# Patient Record
Sex: Male | Born: 1965 | Hispanic: Yes | Marital: Married
Health system: Midwestern US, Community
[De-identification: ages and names within clinical notes are randomized; demographics above are authoritative.]

---

## 2017-12-10 ENCOUNTER — Emergency Department: Admit: 2017-12-10 | Payer: PRIVATE HEALTH INSURANCE | Primary: Cardiovascular Disease

## 2017-12-10 ENCOUNTER — Inpatient Hospital Stay
Admit: 2017-12-10 | Discharge: 2017-12-10 | Disposition: A | Discharge status: Home / Self Care | Payer: PRIVATE HEALTH INSURANCE | Attending: Emergency Medicine

## 2017-12-10 DIAGNOSIS — R200 Anesthesia of skin: Secondary | ICD-10-CM

## 2017-12-10 LAB — METABOLIC PANEL, COMPREHENSIVE
A-G Ratio: 1.1 (ref 0.7–2.8)
ALT (SGPT): 34 U/L (ref 13–61)
AST (SGOT): 14 U/L — ABNORMAL LOW (ref 15–37)
Albumin: 4.1 g/dL (ref 3.5–4.7)
Alk. phosphatase: 90 U/L (ref 45–117)
Anion gap: 11 mmol/L (ref 10–20)
BUN: 16 mg/dL (ref 7–18)
Bilirubin, total: 0.6 mg/dL (ref 0.2–1.0)
CO2: 28 mmol/L (ref 21–32)
Calcium: 8.6 mg/dL (ref 8.5–10.1)
Chloride: 105 mmol/L (ref 98–107)
Creatinine: 0.95 mg/dL (ref 0.70–1.30)
GFR est AA: >60 mL/min/{1.73_m2} (ref >60)
GFR est non-AA: >60 mL/min/{1.73_m2} (ref >60)
Globulin: 3.8 g/dL (ref 1.7–4.7)
Glucose: 138 mg/dL — ABNORMAL HIGH (ref 74–106)
Potassium: 3.4 mmol/L — ABNORMAL LOW (ref 3.5–5.1)
Protein, total: 7.9 g/dL (ref 6.4–8.2)
Sodium: 140 mmol/L (ref 136–145)

## 2017-12-10 LAB — CBC WITH AUTOMATED DIFF
ABS. BASOPHILS: 0 10*3/uL (ref 0.0–0.1)
ABS. EOSINOPHILS: 0.1 10*3/uL (ref 0.0–0.7)
ABS. LYMPHOCYTES: 2.4 10*3/uL (ref 1.5–3.5)
ABS. MONOCYTES: 0.5 10*3/uL (ref 0.0–1.0)
ABS. NEUTROPHILS: 5.7 10*3/uL (ref 1.5–6.6)
BASOPHILS: 0 % (ref 0.0–3.0)
EOSINOPHILS: 1 % (ref 0.0–7.0)
HCT: 43.1 % (ref 42.0–52.0)
HGB: 14.9 g/dL (ref 14.0–18.0)
IMMATURE GRANULOCYTES: 0 % (ref 0.0–2.0)
LYMPHOCYTES: 28 % (ref 18.0–40.0)
MCH: 29.8 PG (ref 27.0–35.0)
MCHC: 34.6 g/dL (ref 30.7–37.3)
MCV: 86.2 FL (ref 81.0–94.0)
MONOCYTES: 6 % (ref 2.0–12.0)
MPV: 10.1 FL (ref 9.2–11.8)
NEUTROPHILS: 65 % (ref 48.0–72.0)
PLATELET: 316 10*3/uL (ref 130–400)
RBC: 5 M/uL (ref 4.70–6.00)
RDW: 12.9 % (ref 11.5–14.0)
WBC: 8.7 10*3/uL (ref 4.8–10.6)

## 2017-12-10 LAB — PROTHROMBIN TIME + INR
INR: 1 (ref 0.8–1.2)
Prothrombin time: 10.2 s (ref 9.4–11.1)

## 2017-12-10 LAB — MAGNESIUM: Magnesium: 2.3 mg/dL (ref 1.6–2.6)

## 2017-12-10 LAB — TROPONIN I: Troponin-I, Qt.: <0.02 NG/ML (ref 0.00–0.05)

## 2017-12-10 LAB — PTT: aPTT: 23.6 s (ref 21.0–28.0)

## 2017-12-10 LAB — BNP: BNP: 30 pg/mL (ref 0–100)

## 2017-12-10 LAB — GLUCOSE, POC: Glucose, bedside: 162 MG/DL — ABNORMAL HIGH (ref 65–110)

## 2017-12-10 NOTE — ED Provider Notes (Signed)
The history is provided by the patient.        12:09 PM: Richard Archer. is a 52 y.o. male with history of hypertension, vertigo who presents to the Emergency Department via ambulance sent from urgent care for evaluation after complaining of numbness in the left 4th and 5th digits of the hand, and outside of his left foot. Patient reports his numbness began yesterday morning around 8:30 am with associated balance abnormality, which resolved on its own. He reports the numbness from yesterday has mostly resolved. He denies any facial tingling, speech problem, present balance problem, or headache. At present, the patient has no other symptoms other than slight residual numbness. He has no family history of neurological illness. No other complaints at this time.          Past Medical History:   Diagnosis Date   ??? Hypertension    ??? Vertigo     2018 diagnosed       History reviewed. No pertinent surgical history.      History reviewed. No pertinent family history.    Social History     Socioeconomic History   ??? Marital status: MARRIED     Spouse name: Not on file   ??? Number of children: Not on file   ??? Years of education: Not on file   ??? Highest education level: Not on file   Occupational History   ??? Not on file   Social Needs   ??? Financial resource strain: Not on file   ??? Food insecurity:     Worry: Not on file     Inability: Not on file   ??? Transportation needs:     Medical: Not on file     Non-medical: Not on file   Tobacco Use   ??? Smoking status: Never Smoker   ??? Smokeless tobacco: Never Used   Substance and Sexual Activity   ??? Alcohol use: Never     Frequency: Never   ??? Drug use: Never   ??? Sexual activity: Yes     Partners: Female   Lifestyle   ??? Physical activity:     Days per week: Not on file     Minutes per session: Not on file   ??? Stress: Not on file   Relationships   ??? Social connections:     Talks on phone: Not on file     Gets together: Not on file     Attends religious service: Not on file      Active member of club or organization: Not on file     Attends meetings of clubs or organizations: Not on file     Relationship status: Not on file   ??? Intimate partner violence:     Fear of current or ex partner: Not on file     Emotionally abused: Not on file     Physically abused: Not on file     Forced sexual activity: Not on file   Other Topics Concern   ??? Not on file   Social History Narrative   ??? Not on file         ALLERGIES: Patient has no known allergies.    Review of Systems   Constitutional: Negative for chills and fever.   HENT: Negative for rhinorrhea and sore throat.    Eyes: Negative for photophobia and visual disturbance.   Respiratory: Negative for cough and shortness of breath.    Cardiovascular: Negative for chest pain  and leg swelling.   Gastrointestinal: Negative for abdominal pain, diarrhea, nausea and vomiting.   Genitourinary: Negative for dysuria and hematuria.   Musculoskeletal: Negative for back pain, gait problem and myalgias.   Skin: Negative for color change and pallor.   Neurological: Positive for numbness (left 4th and 5th digit, left foot). Negative for dizziness, tremors, seizures, syncope, facial asymmetry, speech difficulty, weakness and headaches.        Negative balance abnormality.       Vitals:    12/10/17 1144 12/10/17 1152   BP: (!) 170/91    Pulse: 68    Resp: 18    Temp: 98.4 ??F (36.9 ??C)    SpO2: 98%    Weight:  117.5 kg (259 lb)   Height:  '5\' 11"'$  (1.803 m)          12:09 PM Pulse Oximetry reading is 98 % on room air, which indicates normal oxygenation per Ronn Melena, MD.       Physical Exam   Constitutional: He is oriented to person, place, and time. He appears well-developed and well-nourished. No distress.   HENT:   Head: Normocephalic and atraumatic.   Mouth/Throat: Oropharynx is clear and moist.   Eyes: Pupils are equal, round, and reactive to light. Conjunctivae and EOM are normal. No scleral icterus.   No conjunctival pallor    Neck: Normal range of motion. Neck supple.   Cardiovascular: Normal rate, regular rhythm and normal heart sounds.   Pulses:       Dorsalis pedis pulses are 2+ on the right side, and 2+ on the left side.        Posterior tibial pulses are 2+ on the right side, and 2+ on the left side.   Pulmonary/Chest: Effort normal and breath sounds normal. No respiratory distress. He has no wheezes. He has no rales.   Abdominal: Soft. Bowel sounds are normal. There is no tenderness. There is no rebound and no guarding.   Musculoskeletal: Normal range of motion.   No tenderness or swelling to lower extremities.   Neurological: He is alert and oriented to person, place, and time. He has normal strength. No cranial nerve deficit or sensory deficit.   No facial assymetry, speech clear and fluent, cranial nerves 2-12 grossly intact. Strength and sensation intact throughout, no ataxia   NIHSS=0   Skin: Skin is warm and dry. No rash noted. No pallor.   Nursing note and vitals reviewed.       MDM  Number of Diagnoses or Management Options  Numbness and tingling of foot:   Numbness and tingling of hand:      Amount and/or Complexity of Data Reviewed  Clinical lab tests: ordered and reviewed  Tests in the radiology section of CPT??: ordered and reviewed  Decide to obtain previous medical records or to obtain history from someone other than the patient: yes  Review and summarize past medical records: yes  Discuss the patient with other providers: yes  Independent visualization of images, tracings, or specimens: yes           Procedures    I, Navdeep Halt, Samuel Germany, MD, reviewed the patient's past history, allergies and home medications as documented in the nursing chart.  Labs:  Recent Results (from the past 12 hour(s))   EKG, 12 LEAD, INITIAL    Collection Time: 12/10/17 11:46 AM   Result Value Ref Range    Ventricular Rate 70 BPM    Atrial Rate 70 BPM  P-R Interval 184 ms    QRS Duration 100 ms    Q-T Interval 416 ms     QTC Calculation (Bezet) 449 ms    Calculated P Axis 42 degrees    Calculated R Axis -32 degrees    Calculated T Axis 42 degrees    Diagnosis       Normal sinus rhythm  Left axis deviation  Abnormal ECG  No previous ECGs available     GLUCOSE, POC    Collection Time: 12/10/17 11:47 AM   Result Value Ref Range    Glucose, bedside 162 (H) 65 - 161 MG/DL   METABOLIC PANEL, COMPREHENSIVE    Collection Time: 12/10/17 12:47 PM   Result Value Ref Range    Sodium 140 136 - 145 mmol/L    Potassium 3.4 (L) 3.5 - 5.1 mmol/L    Chloride 105 98 - 107 mmol/L    CO2 28 21 - 32 mmol/L    Anion gap 11 10 - 20 mmol/L    Glucose 138 (H) 74 - 106 mg/dL    BUN 16 7 - 18 mg/dL    Creatinine 0.95 0.70 - 1.30 mg/dL    GFR est AA >60 >60 ml/min/1.108m    GFR est non-AA >60 >60 ml/min/1.728m   Calcium 8.6 8.5 - 10.1 mg/dL    Bilirubin, total 0.6 0.2 - 1.0 mg/dL    ALT (SGPT) 34 13 - 61 U/L    AST (SGOT) 14 (L) 15 - 37 U/L    Alk. phosphatase 90 45 - 117 U/L    Protein, total 7.9 6.4 - 8.2 g/dL    Albumin 4.1 3.5 - 4.7 g/dL    Globulin 3.8 1.7 - 4.7 g/dL    A-G Ratio 1.1 0.7 - 2.8     CBC WITH AUTOMATED DIFF    Collection Time: 12/10/17 12:47 PM   Result Value Ref Range    WBC 8.7 4.8 - 10.6 K/uL    RBC 5.00 4.70 - 6.00 M/uL    HGB 14.9 14.0 - 18.0 g/dL    HCT 43.1 42.0 - 52.0 %    MCV 86.2 81.0 - 94.0 FL    MCH 29.8 27.0 - 35.0 PG    MCHC 34.6 30.7 - 37.3 g/dL    RDW 12.9 11.5 - 14.0 %    PLATELET 316 130 - 400 K/uL    MPV 10.1 9.2 - 11.8 FL    NEUTROPHILS 65 48.0 - 72.0 %    LYMPHOCYTES 28 18.0 - 40.0 %    MONOCYTES 6 2.0 - 12.0 %    EOSINOPHILS 1 0.0 - 7.0 %    BASOPHILS 0 0.0 - 3.0 %    ABS. NEUTROPHILS 5.7 1.5 - 6.6 K/UL    ABS. LYMPHOCYTES 2.4 1.5 - 3.5 K/UL    ABS. MONOCYTES 0.5 0.0 - 1.0 K/UL    ABS. EOSINOPHILS 0.1 0.0 - 0.7 K/UL    ABS. BASOPHILS 0.0 0.0 - 0.1 K/UL    DF AUTOMATED      IMMATURE GRANULOCYTES 0 0.0 - 2.0 %   TROPONIN I    Collection Time: 12/10/17 12:47 PM   Result Value Ref Range     Troponin-I, Qt. <0.02 0.00 - 0.05 NG/ML   MAGNESIUM    Collection Time: 12/10/17 12:47 PM   Result Value Ref Range    Magnesium 2.3 1.6 - 2.6 mg/dL   BNP    Collection Time: 12/10/17 12:47 PM   Result Value  Ref Range    BNP 30 0 - 100 pg/mL   PROTHROMBIN TIME + INR    Collection Time: 12/10/17 12:47 PM   Result Value Ref Range    Prothrombin time 10.2 9.4 - 11.1 sec    INR 1.0 0.8 - 1.2     PTT    Collection Time: 12/10/17 12:47 PM   Result Value Ref Range    aPTT 23.6 21.0 - 28.0 SEC     EKG: sinus rhythm at a rate of 70 BPM, no focal ST changes. Interpreted by Ronn Melena, MD    12:33 PM cardiac monitor reading shows sinus rhythm at 70 BPM. Interpreted by Ronn Melena, MD.    Radiology:    CT Results  (Last 48 hours)               12/10/17 1334  CT HEAD WO CONT Final result    Impression:  IMPRESSION:       Unremarkable CT scan of the brain. Should clinical concern remain further   evaluation with an MRI may be helpful.       Narrative:  History:       Left-sided numbness.       FINDINGS:       CT scanning of the brain was performed with contiguous axial images from the   skull base to the vertex without intravenous contrast material. Sagittal and   coronal reconstructed images are submitted. Scanning was performed utilizing   dose lowering techniques. No prior studies are available for comparison.       There is no evidence of intra or extra-axial hemorrhage. There is no evidence of   midline shift. There is no evidence of mass lesion. There is no definite   evidence of any area of acute cortical infarct.       The ventricles are normal in size and position.       Evaluation of bony structures is unremarkable.               Radiology interpretation reviewed by Ronn Melena, MD      <EMERGENCY DEPARTMENT CASE SUMMARY>  Impression/Differential Diagnosis: rule out TIA, stroke, electrolyte abnormality, neuropathy     ED Course:    52 y.o. male presented to the ED complaining of numbness to the left hand and left foot.     12:33 PM: Will order labs, troponin level, EKG, CT Head, and cardiac monitoring. Will consult Neurology.     1:03 PM: Dr. Elvia Collum (neurology) in the ED to evaluate the patient.     2:00 PM: Case presentation and findings discussed with Dr. Elvia Collum who states the patient is clear from a neurological standpoint and requests outpatient follow up.     2:37 PM: Patient was reassessed prior to discharge. Patient???s symptoms remained stable. I personally discussed discharge plan with patient/guardian, who understands instructions. All questions were answered.  Patient/guardian advised to follow up with PMD and/or Neurologist. Patient/guardian instructed to return to the ER if symptoms persist, change or worsen. Patient agrees with plan.      Final Impression/Diagnosis:   Encounter Diagnoses     ICD-10-CM ICD-9-CM   1. Numbness and tingling of hand R20.0 782.0    R20.2    2. Numbness and tingling of foot R20.0 782.0    R20.2        Patient condition at time of disposition: stable     I have reviewed the following home medications:  Prior to Admission medications    Medication Sig Start Date End Date Taking? Authorizing Provider   nebivolol (BYSTOLIC) 10 mg tablet Take 10 mg by mouth daily. Indications: high blood pressure   Yes Other, Phys, MD   amLODIPine (NORVASC) 10 mg tablet Take 10 mg by mouth daily. Indications: high blood pressure   Yes Other, Phys, MD       Durenda Guthrie, Samuel Germany, MD      I, [place], am serving as a scribe to document services personally performed by Ronn Melena, MD based on my observation and the provider's statements to me.    IRonn Melena, MD, attest that the person(s) noted above, acting as my scribe(s) noted above, has observed my performance of the services and has documented them in accordance with my direction. I have personally  reviewed the above information and have ordered and reviewed the diagnostic studies, unless otherwise noted.

## 2017-12-10 NOTE — ED Notes (Signed)
Critical labs (Potassium) charted on this chart in error

## 2017-12-10 NOTE — ED Notes (Signed)
POC glucose 162. Brain alert cancelled by Dr Prentiss BellsEllerman.

## 2017-12-10 NOTE — ED Notes (Signed)
Pt seen & evaluated by neuro MD.

## 2017-12-10 NOTE — ED Triage Notes (Signed)
Pt reports yesterday he woke up at 0600 normal, went back to sleep awaking at 0830 and noticed he had numbness to left hand and down left leg. Pt also reports he felt "off" noting his gait was not at baseline. Pt sts symptoms were slightly still present today so he went to urgent care and was sent immediately to ED. Brain alert called in triage. Neuro otherwise intact. Pt denies slurred speech, no facial droop present, strength equal bilaterally. Per patient tingling and numbness to hands and leg improving but slightly still present now. Pt sts gait is improving as well since arrival.

## 2017-12-10 NOTE — Consults (Signed)
Richard BumpersSweta Sage Hammill, MD  [address], [address]  [place], [place] [id number]  [phone]  [email or web address] CONSULT NOTE  [facility] Age 52 y.o.   MRN [id number] DOB 06/30/1966       Consulting Physician: Richard BumpersSweta Benedicto Capozzi, MD     Referring Physician: Prentiss BellsEllerman  Chief Complaint:    Chief Complaint   Patient presents with   ??? Numbness   ??? Gait Problem       History of Present Illness:      This is a 52 y.o. man with HTN, who woke up yesterday  with tingling of his left 4th and 5th finger as well as the left 4th and 5th toes and outside of his left foot. Has received cortisone shot for left tennis elbow recently. Denies facial involvement and no weakness.    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          Past Medical History:   Diagnosis Date   ??? Hypertension    ??? Vertigo     2018 diagnosed        History reviewed. No pertinent surgical history.     Current Outpatient Medications   Medication Sig Dispense Refill   ??? nebivolol (BYSTOLIC) 10 mg tablet Take 10 mg by mouth daily. Indications: high blood pressure     ??? amLODIPine (NORVASC) 10 mg tablet Take 10 mg by mouth daily. Indications: high blood pressure         No Known Allergies     History reviewed. No pertinent family history.     Social History     Tobacco Use   ??? Smoking status: Never Smoker   ??? Smokeless tobacco: Never Used   Substance Use Topics   ??? Alcohol use: Never     Frequency: Never          Review of Systems:     Neurological: per hpi  Constitutional: negative for fevers, chills and fatigue  Eyes: No visual disturbances.    Ears, nose, mouth, throat, and face: negative for hearing loss, tinnitus   Respiratory: negative for cough, sputum or hemoptysis  Cardiovascular: negative for chest pain, dyspnea, palpitations  Gastrointestinal: negative for nausea, vomiting and constipation   Genitourinary:negative for dysuria, nocturia and urinary incontinence  Hematologic/lymphatic: negative for bleeding, lymphadenopathy and petechiae  Musculoskeletal:negative for myalgias, arthralgias, stiff joints and neck pain  Behvioral/Psych: negative for anxiety, mood swings and sleep disturbance.      Exam:       Patient Vitals for the past 24 hrs:   Temp Pulse Resp BP SpO2   12/10/17 1144 98.4 ??F (36.9 ??C) 68 18 (!) 170/91 98 %       General: Well developed, well nourished. Patient in no apparent distress                       Skin: No overt signs of rash     Neurological Exam:  Mental Status: Awake, alert and attentive.     Language: Fluent and prosodic without paraphasic errors.  Naming and repetition intact.     Cranial Nerves:    EOMI without nystagmus, no ptosis.  Facial sensation is symmetric to light touch and cold. Facial movement is symmetric.  Palate is midline.  Hearing grossly symmetric, Sternocleidomastoid and trapezius full power  bilaterally.  Tongue is midline.   Eyes: Noninjected sclerae.   Motor:  Full power X4.     Reflexes:   Toes downgoing on left and mute on rt   Sensory:   Symmetric to pinprick in b/l ulnar distribution as well as lateral foot   Gait:  Deferred   Cerebellar:  No dysmetria           Imaging  CT Results (most recent):  No results found for this or any previous visit.  MRI Results (most recent):  No results found for this or any previous visit.      Lab Review    No components found for: TROPQUANT  No results found for: ANA  Recent Results (from the past 24 hour(s))   EKG, 12 LEAD, INITIAL    Collection Time: 12/10/17 11:46 AM   Result Value Ref Range    Ventricular Rate 70 BPM    Atrial Rate 70 BPM    P-R Interval 184 ms    QRS Duration 100 ms    Q-T Interval 416 ms    QTC Calculation (Bezet) 449 ms    Calculated P Axis 42 degrees    Calculated R Axis -32 degrees    Calculated T Axis 42 degrees    Diagnosis       Normal sinus rhythm  Left axis deviation  Abnormal ECG   No previous ECGs available     GLUCOSE, POC    Collection Time: 12/10/17 11:47 AM   Result Value Ref Range    Glucose, bedside 162 (H) 65 - 110 MG/DL     No results found for: CHOL, CHOLPOCT, CHOLX, CHLST, CHOLV, HDL, HDLPOC, LDL, LDLCPOC, LDLC, DLDLP, VLDLC, VLDL, TGLX, TRIGL, TRIGP, TGLPOCT, CHHD, CHHDX      Assessment/Plan:   There is no problem list on file for this patient.      Patient has had tingling in left ulnar distribution and left S1 distribution.  CT head shows no acute and clinical likelihood for a cerebrovascular event is low.    Have recommended f/u as outpatient. Can get a MRI Brain as outpatient if needed. Should benefit from a outpatient EMG of both arms and both legs.    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      Thank you for involving me in care of this patient. Please call with questions or concerns.    Richard Bumpers, MD  [phone]       80 mins was spent taking care of this patient that includes review of prior and current records, coordination of care and discussions with family.

## 2017-12-10 NOTE — ED Notes (Signed)
Pt to CT via stretcher.

## 2017-12-10 NOTE — Consults (Signed)
Consults by Era Bumpers,  MD at 12/10/17 1248                Author: Era Bumpers, MD  Service: Neurology  Author Type: Physician       Filed: 12/10/17 1425  Date of Service: 12/10/17 1248  Status: Signed          Editor: Era Bumpers, MD (Physician)            Consult Orders        1. IP CONSULT TO NEUROLOGY [[id number]] ordered by Blima Singer, MD at 12/10/17 1233                                     Era Bumpers, MD   [address], [address]   [place], [place] [address]  [phone]   [email or web address]      NEUROLOGY CONSULT NOTE           Name  Richard Archer [facility].  Age  52 y.o.          MRN  [id number]  DOB  [birth year]           Consulting Physician: Era Bumpers, MD       Referring Physician: Prentiss Bells   Chief Complaint:       Chief Complaint       Patient presents with        ?  Numbness        ?  Gait Problem             History of Present Illness:         This is a 52 y.o. man with HTN, who woke up yesterday  with tingling of his left 4th and 5th finger as well as the left 4th and 5th toes and  outside of his left foot. Has received cortisone shot for left tennis elbow recently. Denies facial involvement and no weakness.      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                 Past Medical History:        Diagnosis  Date         ?  Hypertension       ?  Vertigo            2018 diagnosed            History reviewed. No pertinent surgical history.         Current Outpatient Medications          Medication  Sig  Dispense  Refill           ?  nebivolol (BYSTOLIC) 10 mg tablet  Take 10 mg by mouth daily. Indications: high blood pressure               ?  amLODIPine (NORVASC) 10 mg tablet  Take 10 mg by mouth daily. Indications: high blood pressure               No Known Allergies       History reviewed. No pertinent family history.         Social History  Tobacco Use          ?  Smoking status:  Never Smoker     ?  Smokeless tobacco:  Never Used       Substance Use Topics         ?  Alcohol use:  Never              Frequency:  Never                 Review of Systems:        Neurological: per hpi   Constitutional: negative for fevers, chills and fatigue   Eyes: No visual disturbances.     Ears, nose, mouth, throat, and face: negative for hearing loss, tinnitus    Respiratory: negative for cough, sputum or hemoptysis   Cardiovascular: negative for chest pain, dyspnea, palpitations   Gastrointestinal: negative for nausea, vomiting and constipation   Genitourinary:negative for dysuria, nocturia and urinary incontinence   Hematologic/lymphatic: negative for bleeding, lymphadenopathy and petechiae   Musculoskeletal:negative for myalgias, arthralgias, stiff joints and neck pain   Behvioral/Psych: negative for anxiety, mood swings and sleep disturbance.         Exam:           Patient Vitals for the past 24 hrs:            Temp  Pulse  Resp  BP  SpO2            12/10/17 1144  98.4 ??F (36.9 ??C)  68  18  (!) 170/91  98 %              General:  Well developed, well nourished. Patient in no apparent distress                                        Skin:  No overt signs of rash        Neurological Exam:      Mental Status:  Awake, alert and attentive.          Language:  Fluent and prosodic without paraphasic errors.  Naming and repetition intact.          Cranial Nerves:     EOMI without nystagmus, no ptosis.  Facial sensation is symmetric to light touch and cold. Facial movement is symmetric.  Palate is midline.  Hearing grossly  symmetric, Sternocleidomastoid and trapezius full power bilaterally.  Tongue is midline.        Eyes:  Noninjected sclerae.     Motor:   Full power X4.          Reflexes:    Toes downgoing on left and mute on rt        Sensory:    Symmetric to pinprick in b/l ulnar distribution as well as lateral foot     Gait:   Deferred        Cerebellar:   No dysmetria                      Imaging   CT Results (most recent):   No results found for this or any previous visit.   MRI Results (most recent):   No results found for this or any previous visit.         Lab Review      No components found for: TROPQUANT   No results  found for: ANA     Recent Results (from the past 24 hour(s))     EKG, 12 LEAD, INITIAL          Collection Time: 12/10/17 11:46 AM         Result  Value  Ref Range            Ventricular Rate  70  BPM       Atrial Rate  70  BPM       P-R Interval  184  ms       QRS Duration  100  ms       Q-T Interval  416  ms       QTC Calculation (Bezet)  449  ms       Calculated P Axis  42  degrees       Calculated R Axis  -32  degrees       Calculated T Axis  42  degrees       Diagnosis                 Normal sinus rhythm   Left axis deviation   Abnormal ECG   No previous ECGs available          GLUCOSE, POC          Collection Time: 12/10/17 11:47 AM         Result  Value  Ref Range            Glucose, bedside  162 (H)  65 - 110 MG/DL        No results found for: CHOL, CHOLPOCT, CHOLX, CHLST, CHOLV, HDL, HDLPOC, LDL, LDLCPOC, LDLC, DLDLP, VLDLC, VLDL, TGLX, TRIGL, TRIGP, TGLPOCT, CHHD, CHHDX           Assessment/Plan:     There is no problem list on file for this patient.         Patient has had tingling in left ulnar distribution and left S1 distribution.  CT head shows no acute and clinical likelihood for a cerebrovascular event is low.      Have recommended f/u as outpatient. Can get a MRI Brain as outpatient if needed. Should benefit from a outpatient EMG of both arms and both legs.      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         Thank you for involving me in care of this patient. Please call with questions or concerns.      Era BumpersSweta Mikena Masoner, MD   [phone]          80 mins was spent taking care of this patient that includes review of prior and current records, coordination of care and  discussions with family.

## 2017-12-12 LAB — EKG 12-LEAD
Atrial Rate: 70 {beats}/min
Diagnosis: NORMAL
P Axis: 42 degrees
P-R Interval: 184 ms
Q-T Interval: 416 ms
QRS Duration: 100 ms
QTc Calculation (Bazett): 449 ms
R Axis: -32 degrees
T Axis: 42 degrees
Ventricular Rate: 70 {beats}/min

## 2017-12-12 LAB — EKG, 12 LEAD, INITIAL
Atrial Rate: 70 {beats}/min
Calculated P Axis: 42 degrees
Calculated R Axis: -32 degrees
Calculated T Axis: 42 degrees
Diagnosis: NORMAL
P-R Interval: 184 ms
Q-T Interval: 416 ms
QRS Duration: 100 ms
QTC Calculation (Bezet): 449 ms
Ventricular Rate: 70 {beats}/min

## 2023-06-20 IMAGING — MR MRI BRAIN WITHOUT AND WITH CONTRAST
8 series · 48 of 48 positions shown · IV contrast (Off)
Comparison: Examination compared to prior examination dated 12/23/2021.

MRI OF THE BRAIN WITH & WITHOUT CONTRAST
CLINICAL HISTORY: History of CVA April 2021. Recent abnormal MRI scan.
TECHNIQUE: Multisequential multiplanar imaging was performed of the brain with and without the intravenous administration of 11 cc of Gadavist. Examination included diffusion-weighted imaging.

[Series 2: T1 · sagittal · 5.0mm · 0.90mm/px · 7 of 21 slices shown (1 of 4)]
[im 1/21]
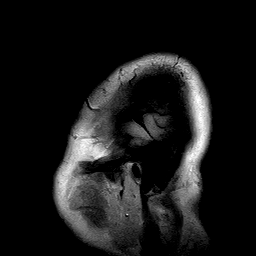
[im 4/21]
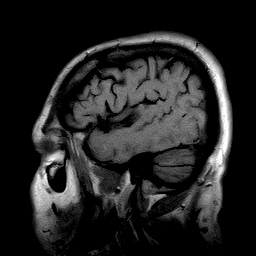
[im 7/21]
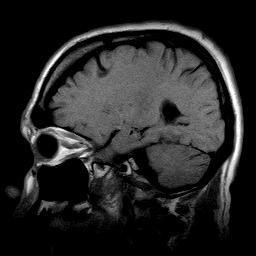
[im 11/21]
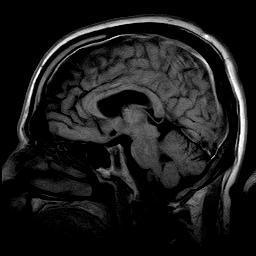
[im 14/21]
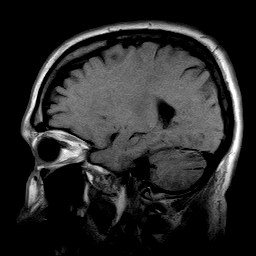
[im 17/21]
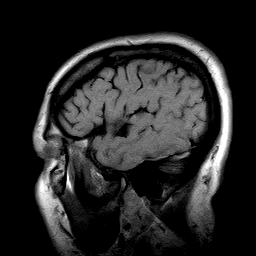
[im 21/21]
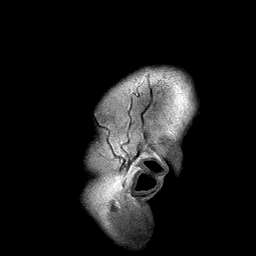

[Series 3: T2 · axial · 5.0mm · 0.94mm/px · z∈[-60,+87]mm · 7 of 22 slices shown]
[im 1/22]
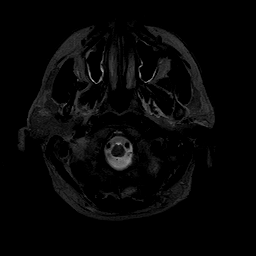
[im 4/22]
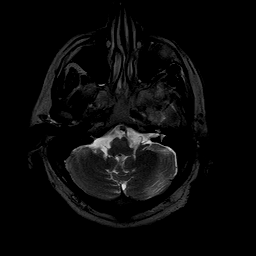
[im 8/22]
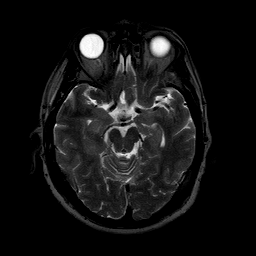
[im 11/22]
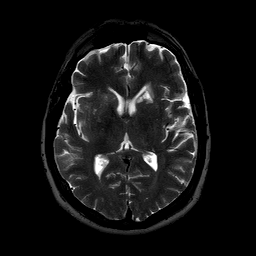
[im 15/22]
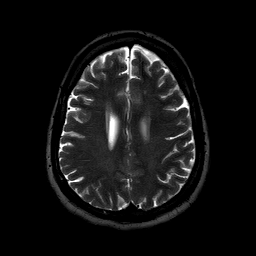
[im 18/22]
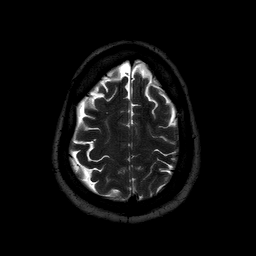
[im 22/22]
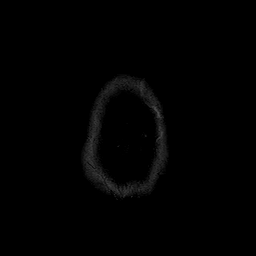

[Series 4: T1 · axial · 5.0mm · 0.94mm/px · z∈[-60,+87]mm · 6 of 22 slices shown (2 of 4)]
[im 1/22]
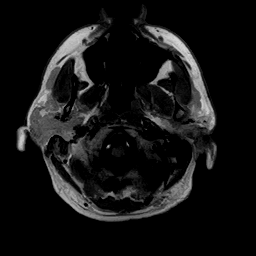
[im 5/22]
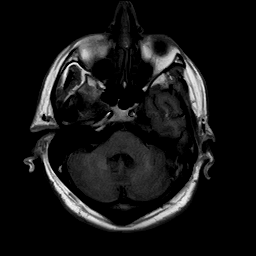
[im 9/22]
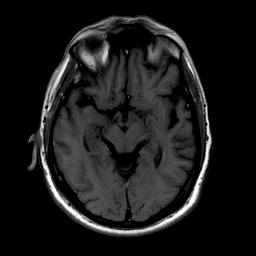
[im 13/22]
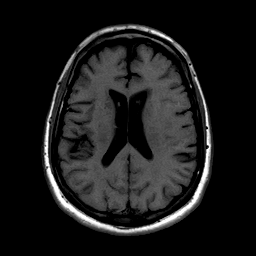
[im 17/22]
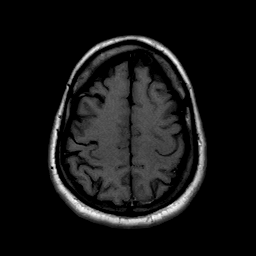
[im 22/22]
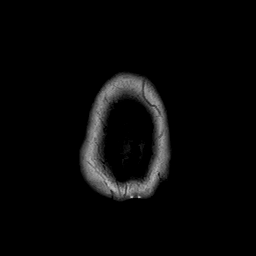

[Series 10: DWI · axial · 6.0mm · 1.88mm/px · z∈[-59,+77]mm · 5 of 18 slices shown]
[im 1/18]
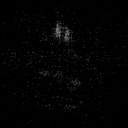
[im 5/18]
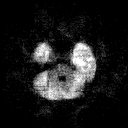
[im 9/18]
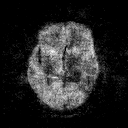
[im 13/18]
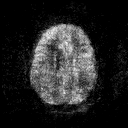
[im 18/18]
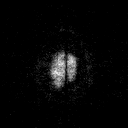

[Series 11: ADC · axial · 6.0mm · 1.88mm/px · z∈[-59,+77]mm · 5 of 18 slices shown]
[im 1/18]
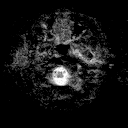
[im 5/18]
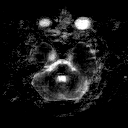
[im 9/18]
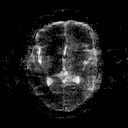
[im 13/18]
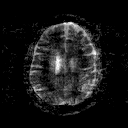
[im 18/18]
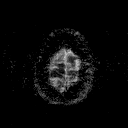

[Series 12: FLAIR · axial · 5.0mm · 0.94mm/px · z∈[-60,+87]mm · 6 of 22 slices shown]
[im 1/22]
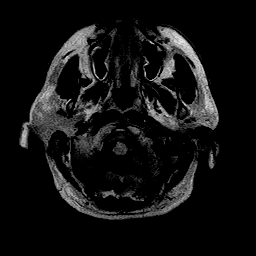
[im 5/22]
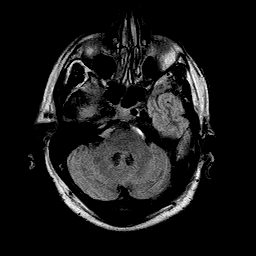
[im 9/22]
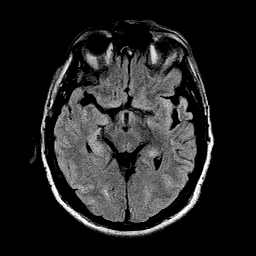
[im 13/22]
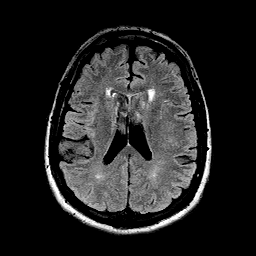
[im 17/22]
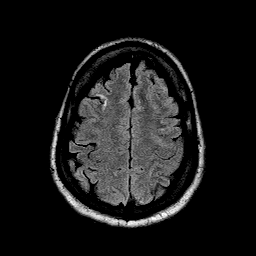
[im 22/22]
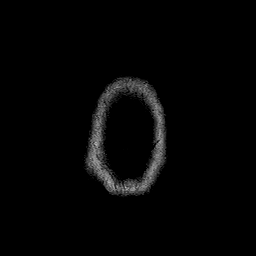

[Series 13: T1 · axial · 5.0mm · 0.94mm/px · z∈[-60,+87]mm · 6 of 22 slices shown (3 of 4)]
[im 1/22]
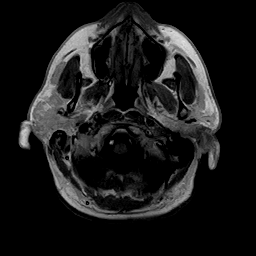
[im 5/22]
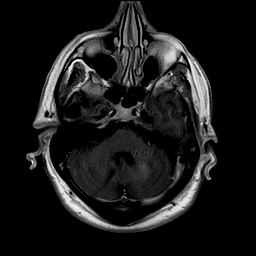
[im 9/22]
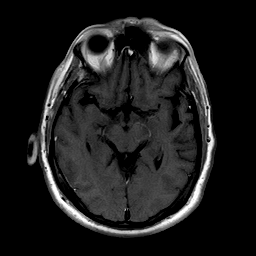
[im 13/22]
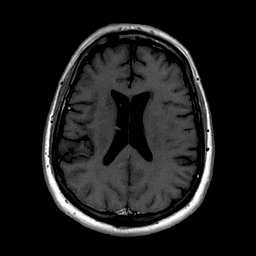
[im 17/22]
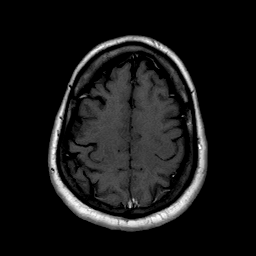
[im 22/22]
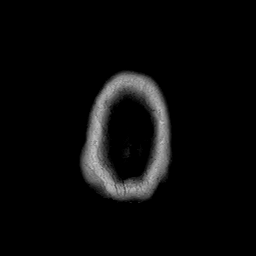

[Series 14: T1 · coronal · 5.0mm · 0.90mm/px · 6 of 20 slices shown (4 of 4)]
[im 1/20]
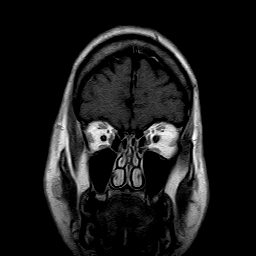
[im 4/20]
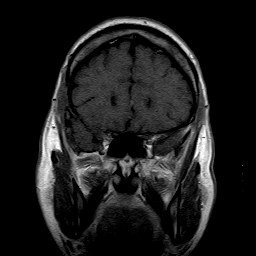
[im 8/20]
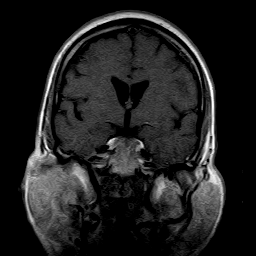
[im 12/20]
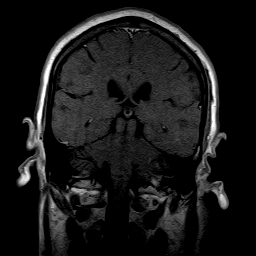
[im 16/20]
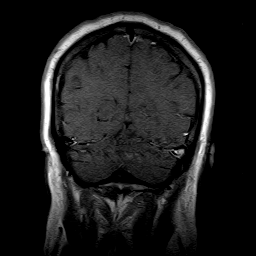
[im 20/20]
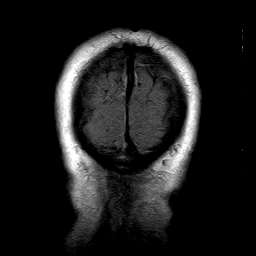

[48 of 48 positions shown; findings below may reference images not displayed]

We also have the report but not the imaging from an MRI dated 12/07/2022, which was also an outside MRI.
FINDINGS: There is no MRI evidence of hemorrhage, mass effect, midline shift, extra-axial fluid collections, or hydrocephalus. There does appear to be involutional change as well as significant demyelination in the periventricular and deep white matter. There is a more focal area of subcortical abnormal signal intensity in the right frontal region. No abnormal enhancement identified. 

Prior exam raised the concern of an area of increased signal intensity in the medulla oblongata. There is an area of increased signal intensity in that area, however, no abnormal enhancement associated with this or the remainder of the brain. There is no evidence of restricted diffusion. 

There does appear to be some mild ethmoid and maxillary sinus inflammatory change.
IMPRESSION: 1.
Involutional change with prominence of the ventricles and sulci as well as demyelination in the periventricular and deep white matter consistent with small vessel ischemic change and/or ischemic leukomalacia. More focal area of subcortical increased signal intensity in the right frontal region may represent a more focal ischemic event, however, this does not appear to be an acute or subacute finding. In addition, there is no acute or subacute ischemia or other cause of restricted diffusion in the remainder of the brain. No focal enhancing lesions in the brain. 

2.
Clinical concern of increased signal intensity in the area of the medulla on an outside MRI, which we do not have the imaging for. There does appear to be some increased signal intensity in that area on the current examination but no abnormal enhancement that would suggest a lesion. 

3.
Some ethmoid sinus inflammatory changes as well as mucoperiosteal thickening in both maxillary sinuses.
